# Patient Record
Sex: Male | Born: 1990 | Race: Black or African American | Hispanic: No | Marital: Single | Smoking: Never smoker
Health system: Southern US, Community
[De-identification: ages and names within clinical notes are randomized; demographics above are authoritative.]

## PROBLEM LIST (undated history)

## (undated) HISTORY — PX: HAND SURGERY: SHX662

---

## 2018-04-14 ENCOUNTER — Encounter (HOSPITAL_BASED_OUTPATIENT_CLINIC_OR_DEPARTMENT_OTHER): Payer: Self-pay | Admitting: Emergency Medicine

## 2018-04-14 ENCOUNTER — Emergency Department (HOSPITAL_BASED_OUTPATIENT_CLINIC_OR_DEPARTMENT_OTHER): Payer: Self-pay

## 2018-04-14 ENCOUNTER — Other Ambulatory Visit: Payer: Self-pay

## 2018-04-14 ENCOUNTER — Emergency Department (HOSPITAL_BASED_OUTPATIENT_CLINIC_OR_DEPARTMENT_OTHER)
Admission: EM | Admit: 2018-04-14 | Discharge: 2018-04-14 | Disposition: A | Discharge status: Home / Self Care | Payer: Self-pay | Attending: Emergency Medicine | Admitting: Emergency Medicine

## 2018-04-14 DIAGNOSIS — R079 Chest pain, unspecified: Secondary | ICD-10-CM

## 2018-04-14 DIAGNOSIS — R0789 Other chest pain: Secondary | ICD-10-CM | POA: Insufficient documentation

## 2018-04-14 MED ORDER — NAPROXEN 500 MG PO TABS: 500.0000 mg | ORAL_TABLET | Freq: Two times a day (BID) | ORAL | 0 refills | Status: AC

## 2018-04-14 MED ORDER — NAPROXEN 250 MG PO TABS
500.0000 mg | ORAL_TABLET | Freq: Once | ORAL | Status: AC
  Administered 2018-04-14: 500 mg via ORAL
  Filled 2018-04-14: qty 2

## 2018-04-14 NOTE — Discharge Instructions (Addendum)
You were evaluated in the Emergency Department and after careful evaluation, we did not find any emergent condition requiring admission or further testing in the hospital.  Your symptoms today seem to be due to costochondritis, or inflammation of the joints/muscles between the ribs.  Take the anti-inflammatory as directed.  It is important to follow-up with your primary care provider.  Please return to the Emergency Department if you experience any worsening of your condition.  We encourage you to follow up with a primary care provider.  Thank you for allowing [place] to be a part of your care.

## 2018-04-14 NOTE — ED Provider Notes (Signed)
[facility] Emergency Department Provider Note MRN:  [id number]  Arrival date & time: 04/14/18     Chief Complaint   Chest Pain   History of Present Illness   Jared Sullivan is a 28 y.o. year-old male with no pertinent past medical history presenting to the ED with chief complaint of chest pain.  2 days of constant left-sided chest pain, moderate in severity, worse with movement, worse with deep breaths.  Denies trauma.  No fever, no cough, no shortness of breath, no dizziness or diaphoresis, no nausea or vomiting.  Denies abdominal pain.  Review of Systems  A complete 10 system review of systems was obtained and all systems are negative except as noted in the HPI and PMH.   Patient's Health History   History reviewed. No pertinent past medical history.  History reviewed. No pertinent surgical history.  No family history on file.  Social History   Socioeconomic History  . Marital status: Not on file    Spouse name: Not on file  . Number of children: Not on file  . Years of education: Not on file  . Highest education level: Not on file  Occupational History  . Not on file  Social Needs  . Financial resource strain: Not on file  . Food insecurity:    Worry: Not on file    Inability: Not on file  . Transportation needs:    Medical: Not on file    Non-medical: Not on file  Tobacco Use  . Smoking status: Never Smoker  . Smokeless tobacco: Never Used  Substance and Sexual Activity  . Alcohol use: Yes    Comment: ocassionally  . Drug use: Yes    Types: Marijuana  . Sexual activity: Not on file  Lifestyle  . Physical activity:    Days per week: Not on file    Minutes per session: Not on file  . Stress: Not on file  Relationships  . Social connections:    Talks on phone: Not on file    Gets together: Not on file    Attends religious service: Not on file    Active member of club or organization: Not on file    Attends meetings of clubs or  organizations: Not on file    Relationship status: Not on file  . Intimate partner violence:    Fear of current or ex partner: Not on file    Emotionally abused: Not on file    Physically abused: Not on file    Forced sexual activity: Not on file  Other Topics Concern  . Not on file  Social History Narrative  . Not on file     Physical Exam  Vital Signs and Nursing Notes reviewed Vitals:   04/14/18 2219  BP: 135/81  Pulse: 64  Resp: 16  Temp: 98.2 F (36.8 C)  SpO2: 100%    CONSTITUTIONAL: Well-appearing, NAD NEURO:  Alert and oriented x 3, no focal deficits EYES:  eyes equal and reactive ENT/NECK:  no LAD, no JVD CARDIO: Regular rate, well-perfused, normal S1 and S2; focal tenderness to palpation to the left anterior ribs PULM:  CTAB no wheezing or rhonchi GI/GU:  normal bowel sounds, non-distended, non-tender MSK/SPINE:  No gross deformities, no edema SKIN:  no rash, atraumatic PSYCH:  Appropriate speech and behavior  Diagnostic and Interventional Summary    EKG Interpretation  Date/Time:  Thursday April 14 2018 22:19:25 EST Ventricular Rate:  56 PR Interval:  188 QRS  Duration: 94 QT Interval:  372 QTC Calculation: 358 R Axis:   63 Text Interpretation:  Sinus bradycardia Otherwise normal ECG Confirmed by Kennis Carina [phone]) on 04/14/2018 10:34:39 PM      Labs Reviewed - No data to display  DG Chest 2 View  Final Result      Medications  naproxen (NAPROSYN) tablet 500 mg (500 mg Oral Given 04/14/18 2245)     Procedures Critical Care  ED Course and Medical Decision Making  I have reviewed the triage vital signs and the nursing notes.  Pertinent labs & imaging results that were available during my care of the patient were reviewed by me and considered in my medical decision making (see below for details).  Reproducible on exam, PERC negative, no evidence of pneumothorax on chest x-ray.  EKG with no concerning features.  Consistent with  costochondritis, NSAIDs.  After the discussed management above, the patient was determined to be safe for discharge.  The patient was in agreement with this plan and all questions regarding their care were answered.  ED return precautions were discussed and the patient will return to the ED with any significant worsening of condition.  Elmer Sow. Pilar Plate, MD [facility] Emergency [facility] [email or web address]  Final Clinical Impressions(s) / ED Diagnoses     ICD-10-CM   1. Chest pain R07.9 DG Chest 2 View    DG Chest 2 View    ED Discharge Orders         Ordered    naproxen (NAPROSYN) 500 MG tablet  2 times daily     04/14/18 2319             Sabas Sous, MD 04/14/18 2320

## 2018-04-14 NOTE — ED Triage Notes (Addendum)
Pt c/o left sided chest pain x 2 days ago. Pt states pain worsens with deep inspiration and movement.

## 2019-07-12 ENCOUNTER — Ambulatory Visit: Payer: Self-pay | Attending: Internal Medicine

## 2019-07-12 DIAGNOSIS — Z20822 Contact with and (suspected) exposure to covid-19: Secondary | ICD-10-CM

## 2019-07-12 DIAGNOSIS — U071 COVID-19: Secondary | ICD-10-CM | POA: Insufficient documentation

## 2019-07-13 ENCOUNTER — Telehealth: Payer: Self-pay

## 2019-07-13 NOTE — Telephone Encounter (Signed)
Wife checking on COVID 19 results, not available yet. 

## 2019-07-14 ENCOUNTER — Telehealth: Payer: Self-pay

## 2019-07-14 LAB — NOVEL CORONAVIRUS, NAA: SARS-CoV-2, NAA: DETECTED — AB

## 2019-07-14 LAB — SARS-COV-2, NAA 2 DAY TAT

## 2019-07-14 NOTE — Telephone Encounter (Signed)
Patient and his wife called for COVID-19 test result .  Wife was told her husbands test was still active and had not resulted.  She verbalized understanding.

## 2019-07-16 ENCOUNTER — Telehealth: Payer: Self-pay | Admitting: General Practice

## 2019-07-16 NOTE — Telephone Encounter (Signed)
Contact tracer called to verify the patients demographic information.

## 2019-08-14 ENCOUNTER — Ambulatory Visit: Payer: Self-pay | Attending: Internal Medicine

## 2019-08-14 DIAGNOSIS — Z20822 Contact with and (suspected) exposure to covid-19: Secondary | ICD-10-CM

## 2020-02-13 IMAGING — CR DG CHEST 2V
2 series · 2 of 2 positions shown · non-contrast
Comparison: None.

CLINICAL DATA: Left-sided chest pain for 2 days

EXAM:
CHEST - 2 VIEW

[w chest pa]
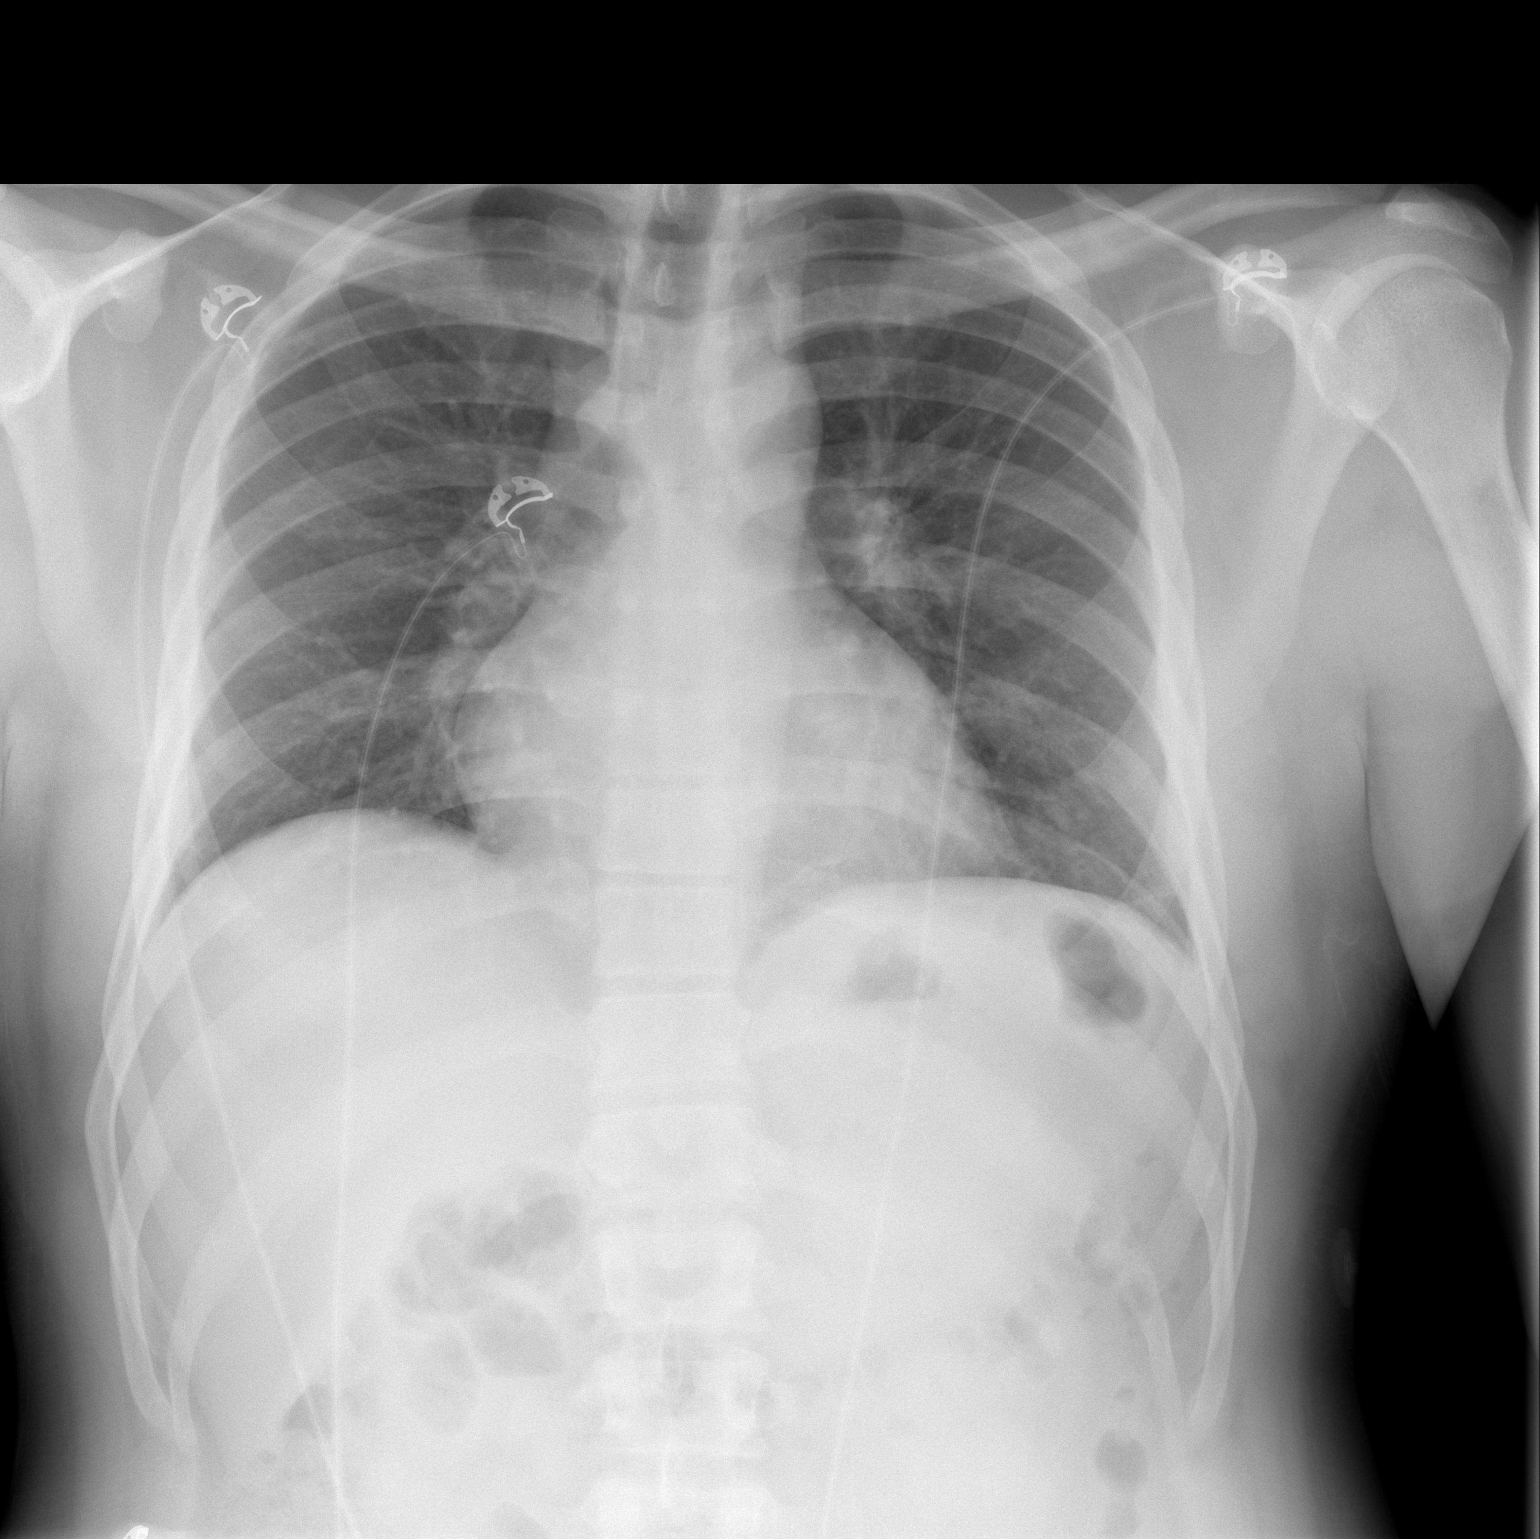

[w chest lat]
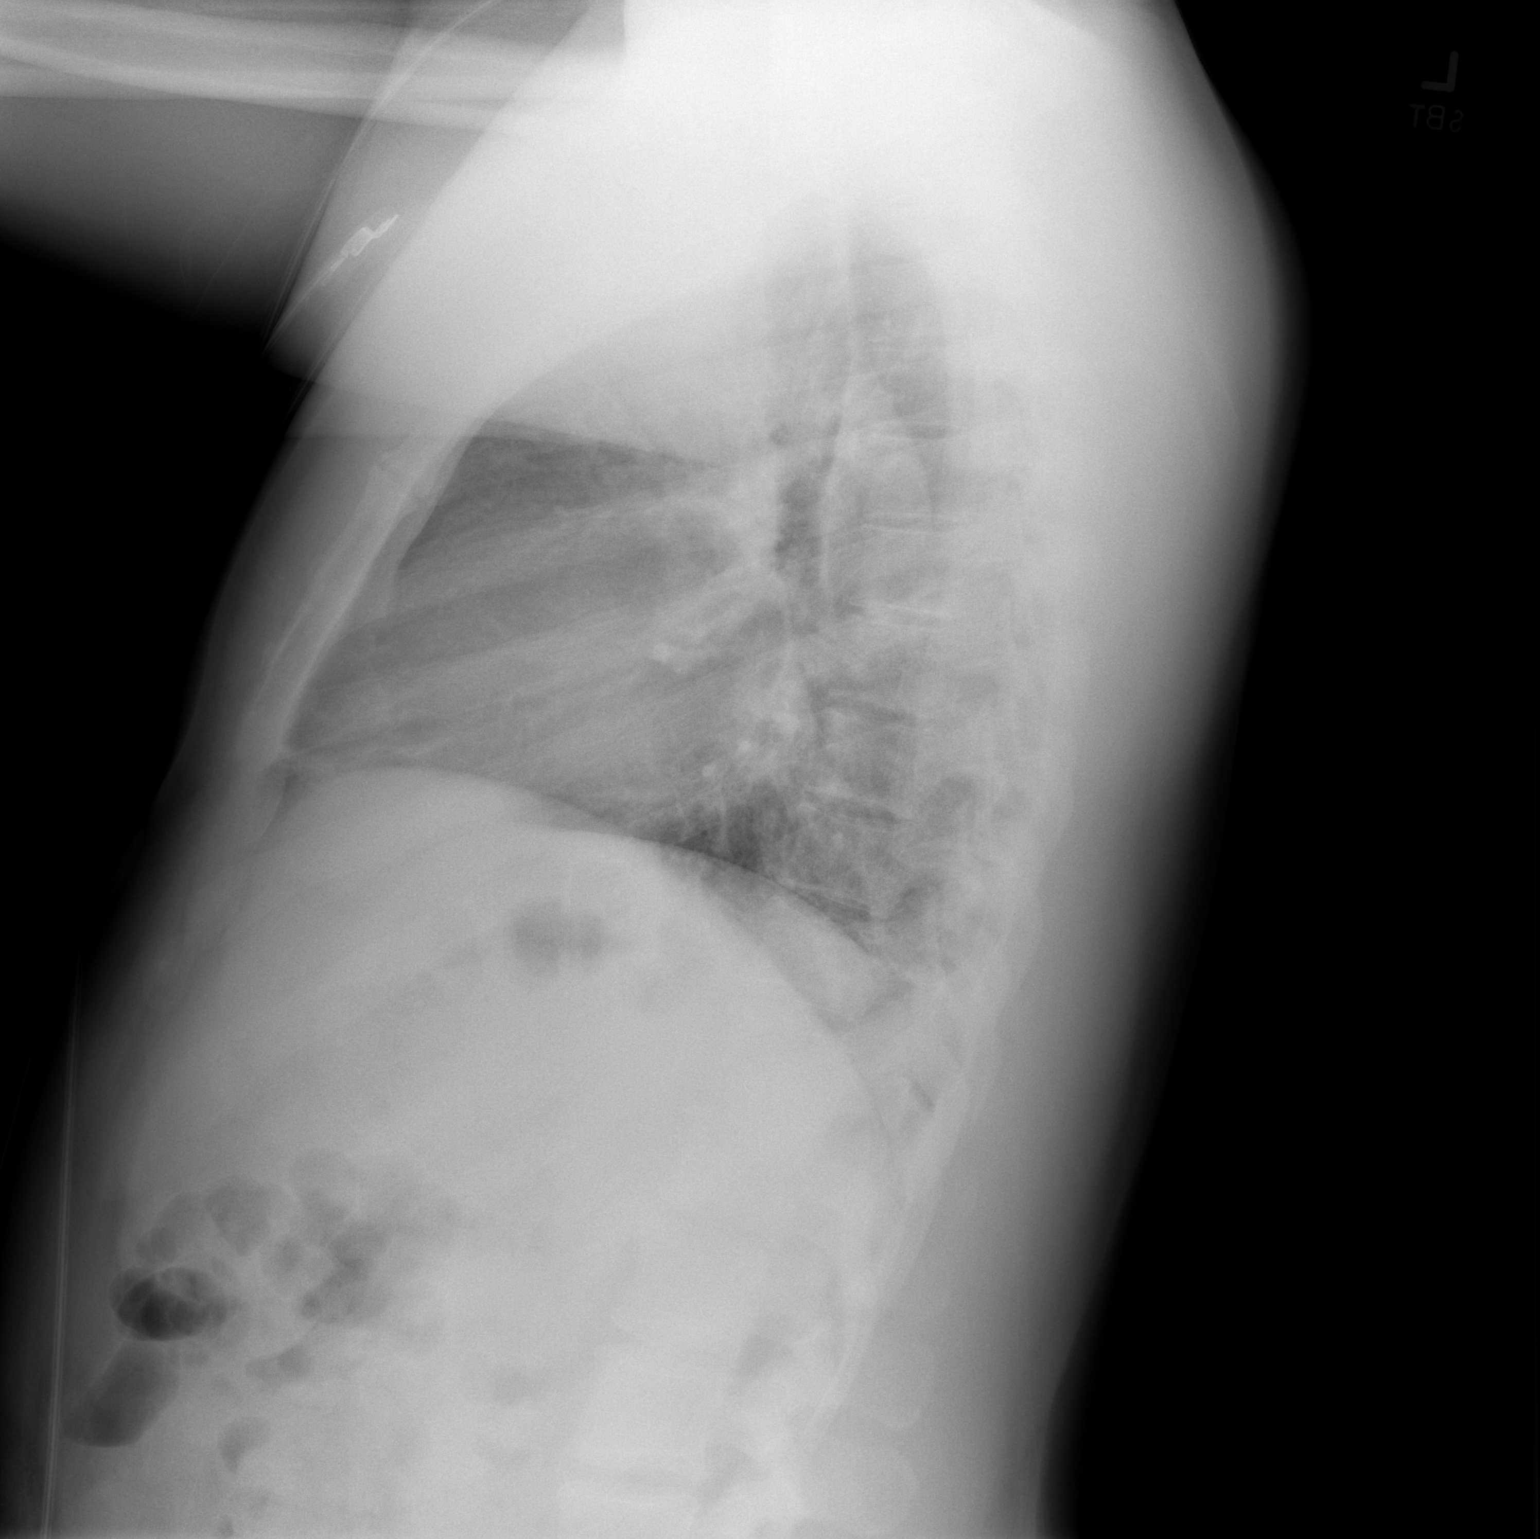

[2 of 2 positions shown; findings below may reference images not displayed]

FINDINGS: The heart size and mediastinal contours are within normal limits.
Both lungs are clear. The visualized skeletal structures are
unremarkable.
IMPRESSION: No active cardiopulmonary disease.

## 2020-03-10 ENCOUNTER — Other Ambulatory Visit: Payer: Self-pay

## 2020-03-10 DIAGNOSIS — L219 Seborrheic dermatitis, unspecified: Secondary | ICD-10-CM | POA: Insufficient documentation

## 2020-03-11 ENCOUNTER — Emergency Department (HOSPITAL_BASED_OUTPATIENT_CLINIC_OR_DEPARTMENT_OTHER)
Admission: EM | Admit: 2020-03-11 | Discharge: 2020-03-11 | Disposition: A | Discharge status: Home / Self Care | Payer: Self-pay | Attending: Emergency Medicine | Admitting: Emergency Medicine

## 2020-03-11 ENCOUNTER — Encounter (HOSPITAL_BASED_OUTPATIENT_CLINIC_OR_DEPARTMENT_OTHER): Payer: Self-pay | Admitting: Emergency Medicine

## 2020-03-11 DIAGNOSIS — L219 Seborrheic dermatitis, unspecified: Secondary | ICD-10-CM

## 2020-03-11 MED ORDER — KETOCONAZOLE 2 % EX SHAM: 1.0000 "application " | MEDICATED_SHAMPOO | CUTANEOUS | 0 refills | Status: AC

## 2020-03-11 NOTE — Discharge Instructions (Signed)
You were seen today for a rash.  It is most consistent with some work dermatitis.  You may trial [facility] or used to be prescription for a different shampoo option.  Make sure that you use good in the beard maintenance.

## 2020-03-11 NOTE — ED Provider Notes (Signed)
[facility] EMERGENCY DEPARTMENT Provider Note   CSN: [id number] Arrival date & time: 03/10/20  2352     History Chief Complaint  Patient presents with   Rash    Jared Sullivan is a 29 y.o. male.  HPI     This is a 29 year old male with no reported past medical history who presents with his face and scalp rash.  Patient reports 2 to 68-month history of worsening scalp, face, and armpit rash.  He states that it is occasionally itchy and flaky.  He has tried over-the-counter shampoos with minimal relief.  He not had any fevers or systemic symptoms.  He has not seen his doctor.  History reviewed. No pertinent past medical history.  There are no problems to display for this patient.   History reviewed. No pertinent surgical history.     No family history on file.  Social History   Tobacco Use   Smoking status: Never Smoker   Smokeless tobacco: Never Used  Substance Use Topics   Alcohol use: Yes    Comment: ocassionally   Drug use: Yes    Types: Marijuana    Home Medications Prior to Admission medications   Medication Sig Start Date End Date Taking? Authorizing Provider  ketoconazole (NIZORAL) 2 % shampoo Apply 1 application topically 2 (two) times a week. 03/11/20   Leomar Westberg, Mayer Masker, MD  naproxen (NAPROSYN) 500 MG tablet Take 1 tablet (500 mg total) by mouth 2 (two) times daily. 04/14/18   Sabas Sous, MD    Allergies    Patient has no known allergies.  Review of Systems   Review of Systems  Constitutional: Negative for fever.  Skin: Positive for rash. Negative for color change.  All other systems reviewed and are negative.   Physical Exam Updated Vital Signs BP 128/85 (BP Location: Right Arm)    Pulse 63    Temp 98 F (36.7 C) (Oral)    Resp 16    Ht 1.753 m (5\' 9" )    Wt 105.6 kg    SpO2 98%    BMI 34.39 kg/m   Physical Exam Vitals and nursing note reviewed.  Constitutional:      Appearance: He is well-developed and  well-nourished.  HENT:     Head: Normocephalic and atraumatic.     Nose: Nose normal.     Mouth/Throat:     Mouth: Mucous membranes are moist.  Eyes:     Pupils: Pupils are equal, round, and reactive to light.  Cardiovascular:     Rate and Rhythm: Normal rate and regular rhythm.  Pulmonary:     Effort: Pulmonary effort is normal. No respiratory distress.  Abdominal:     Palpations: Abdomen is soft.  Musculoskeletal:        General: No edema.     Cervical back: Neck supple.  Skin:    General: Skin is warm and dry.     Comments: Small patch of scaling noted over the right side of the scalp, no alopecia noted, no redness or raised characteristics to the lesion Flaking noted of the beard and within the left armpit  Neurological:     Mental Status: He is alert and oriented to person, place, and time.  Psychiatric:        Mood and Affect: Mood and affect and mood normal.     ED Results / Procedures / Treatments   Labs (all labs ordered are listed, but only abnormal results are displayed) Labs Reviewed -  No data to display  EKG None  Radiology No results found.  Procedures Procedures (including critical care time)  Medications Ordered in ED Medications - No data to display  ED Course  I have reviewed the triage vital signs and the nursing notes.  Pertinent labs & imaging results that were available during my care of the patient were reviewed by me and considered in my medical decision making (see chart for details).    MDM Rules/Calculators/A&P                          Patient presents with ongoing rash and lesions of the scalp, beard, armpit.  He is overall nontoxic vital signs are reassuring.  Exam is most consistent with ongoing and worsening support dermatitis.  Lower suspicion for fungal infection as patient does not have any evidence of alopecia.  Distribution is consistent with support dermatitis.  Reports he has tried [employer] with minimal results.  Will  provide ketoconazole shampoo.  Recommend dermatology follow-up if not improving and he continues to be bothered by this.  After history, exam, and medical workup I feel the patient has been appropriately medically screened and is safe for discharge home. Pertinent diagnoses were discussed with the patient. Patient was given return precautions.  Final Clinical Impression(s) / ED Diagnoses Final diagnoses:  Seborrheic dermatitis    Rx / DC Orders ED Discharge Orders         Ordered    ketoconazole (NIZORAL) 2 % shampoo  2 times weekly        03/11/20 0100           Miroslav Gin, Mayer Masker, MD 03/11/20 0104

## 2020-03-11 NOTE — ED Triage Notes (Signed)
Rash to right head and mouth for several weeks, appears white in the morning per patient. Itchy

## 2022-03-24 ENCOUNTER — Encounter (HOSPITAL_BASED_OUTPATIENT_CLINIC_OR_DEPARTMENT_OTHER): Payer: Self-pay

## 2022-03-24 ENCOUNTER — Emergency Department (HOSPITAL_BASED_OUTPATIENT_CLINIC_OR_DEPARTMENT_OTHER)
Admission: EM | Admit: 2022-03-24 | Discharge: 2022-03-25 | Discharge status: Against Medical Advice | Payer: Medicaid Other | Attending: Emergency Medicine | Admitting: Emergency Medicine

## 2022-03-24 ENCOUNTER — Other Ambulatory Visit: Payer: Self-pay

## 2022-03-24 ENCOUNTER — Emergency Department (HOSPITAL_BASED_OUTPATIENT_CLINIC_OR_DEPARTMENT_OTHER): Payer: Medicaid Other

## 2022-03-24 ENCOUNTER — Emergency Department (HOSPITAL_BASED_OUTPATIENT_CLINIC_OR_DEPARTMENT_OTHER): Admission: EM | Admit: 2022-03-24 | Discharge: 2022-03-24 | Discharge status: Against Medical Advice | Payer: Self-pay

## 2022-03-24 DIAGNOSIS — Z1152 Encounter for screening for COVID-19: Secondary | ICD-10-CM | POA: Insufficient documentation

## 2022-03-24 DIAGNOSIS — Z5321 Procedure and treatment not carried out due to patient leaving prior to being seen by health care provider: Secondary | ICD-10-CM | POA: Insufficient documentation

## 2022-03-24 DIAGNOSIS — R509 Fever, unspecified: Secondary | ICD-10-CM | POA: Diagnosis present

## 2022-03-24 DIAGNOSIS — R0602 Shortness of breath: Secondary | ICD-10-CM | POA: Insufficient documentation

## 2022-03-24 DIAGNOSIS — R059 Cough, unspecified: Secondary | ICD-10-CM | POA: Diagnosis not present

## 2022-03-24 LAB — RESP PANEL BY RT-PCR (RSV, FLU A&B, COVID)  RVPGX2
Influenza A by PCR: POSITIVE — AB
Influenza B by PCR: NEGATIVE
Resp Syncytial Virus by PCR: NEGATIVE
SARS Coronavirus 2 by RT PCR: NEGATIVE

## 2022-03-24 MED ORDER — ALBUTEROL SULFATE HFA 108 (90 BASE) MCG/ACT IN AERS: 2.0000 | INHALATION_SPRAY | RESPIRATORY_TRACT | Status: DC | PRN

## 2022-03-24 MED ORDER — ACETAMINOPHEN 325 MG PO TABS
650.0000 mg | ORAL_TABLET | Freq: Once | ORAL | Status: AC | PRN
  Administered 2022-03-24: 650 mg via ORAL
  Filled 2022-03-24: qty 2

## 2022-03-24 NOTE — ED Triage Notes (Signed)
Pt endorses fever, cough, and SOB x1 day. Pt denies hx of asthma; has not taken any medicine at home. Pt feels fatigued and has body aches.
